# Patient Record
Sex: Male | Born: 1935 | Hispanic: No | Marital: Married
Health system: Northeastern US, Academic
[De-identification: ages and names within clinical notes are randomized; demographics above are authoritative.]

---

## 2009-09-14 IMAGING — CR RAD EXT HIP RT SINGLE VIEW
1 series · 1 of 1 positions shown · non-contrast
Comparison: none

[AP]
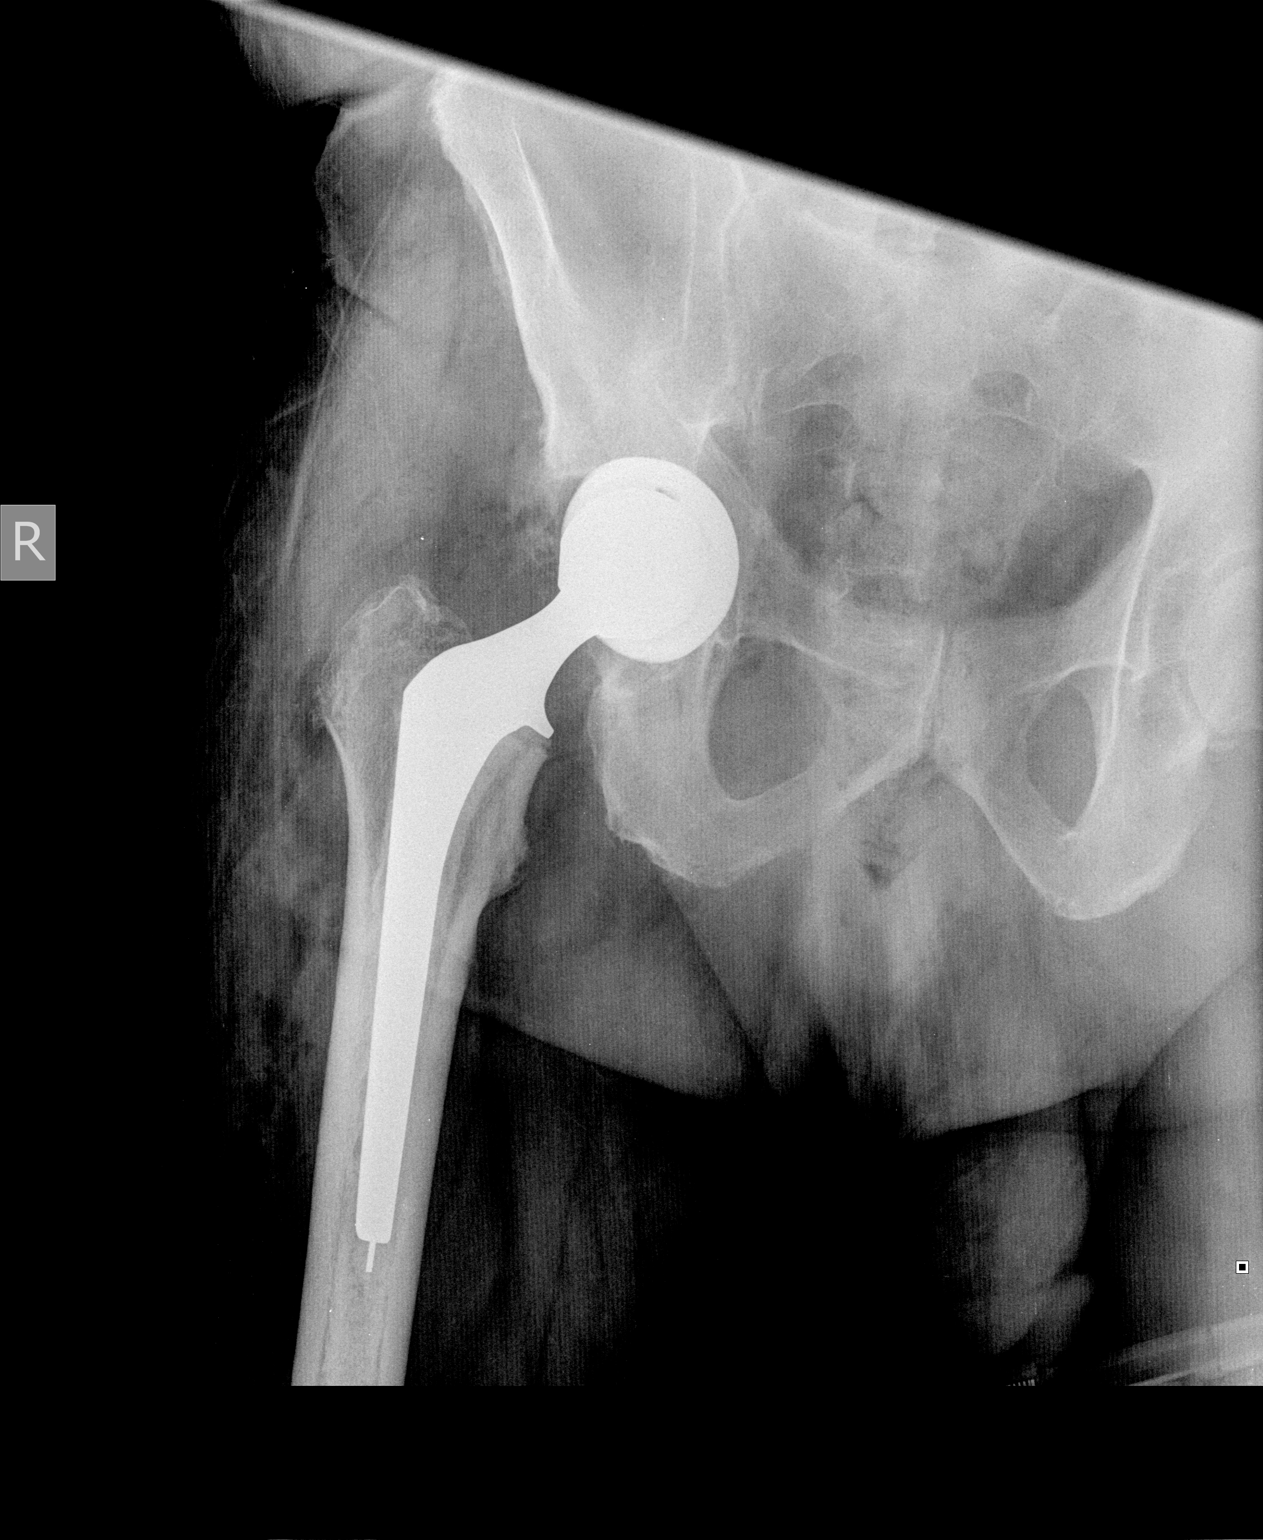

[1 of 1 positions shown; findings below may reference images not displayed]

REASON FOR EXAMINATION-  78 year old male with a hip replacement.

AP view of the right hip demonstrates that the patient has undergone

hip replacement surgery.  The acetabular and femoral components of the

prosthesis are seated in their respective bony beds.  No additional

bony abnormalities are seen.

IMPRESSION-

HIP REPLACEMENT.

## 2020-12-14 ENCOUNTER — Inpatient Hospital Stay: Admit: 2020-12-14 | Discharge: 2020-12-15 | Payer: PRIVATE HEALTH INSURANCE | Attending: Emergency Medicine

## 2020-12-15 ENCOUNTER — Emergency Department: Admit: 2020-12-15 | Payer: PRIVATE HEALTH INSURANCE | Primary: Internal Medicine

## 2020-12-15 DIAGNOSIS — R059 Cough, unspecified: Secondary | ICD-10-CM

## 2020-12-15 DIAGNOSIS — R0981 Nasal congestion: Secondary | ICD-10-CM

## 2020-12-15 DIAGNOSIS — M25562 Pain in left knee: Secondary | ICD-10-CM

## 2020-12-15 DIAGNOSIS — M25552 Pain in left hip: Secondary | ICD-10-CM

## 2020-12-15 DIAGNOSIS — M545 Low back pain, unspecified: Secondary | ICD-10-CM

## 2020-12-15 DIAGNOSIS — Z20822 Contact with and (suspected) exposure to covid-19: Secondary | ICD-10-CM

## 2020-12-15 DIAGNOSIS — J101 Influenza due to other identified influenza virus with other respiratory manifestations: Secondary | ICD-10-CM

## 2020-12-15 LAB — CBC WITH AUTO DIFFERENTIAL
BKR WAM ABSOLUTE IMMATURE GRANULOCYTES.: 0.03 x 1000/ÂµL (ref 0.00–0.30)
BKR WAM ABSOLUTE LYMPHOCYTE COUNT.: 0.48 x 1000/ÂµL — ABNORMAL LOW (ref 0.60–3.70)
BKR WAM ABSOLUTE NRBC (2 DEC): 0 x 1000/ÂµL (ref 0.00–1.00)
BKR WAM ANALYZER ANC: 4.86 x 1000/ÂµL (ref 2.00–7.60)
BKR WAM BASOPHIL ABSOLUTE COUNT.: 0.02 x 1000/ÂµL (ref 0.00–1.00)
BKR WAM BASOPHILS: 0.3 % (ref 0.0–1.4)
BKR WAM EOSINOPHIL ABSOLUTE COUNT.: 0.01 x 1000/ÂµL (ref 0.00–1.00)
BKR WAM EOSINOPHILS: 0.2 % (ref 0.0–5.0)
BKR WAM HEMATOCRIT (2 DEC): 37.1 % — ABNORMAL LOW (ref 38.50–50.00)
BKR WAM HEMOGLOBIN: 12.4 g/dL — ABNORMAL LOW (ref 13.2–17.1)
BKR WAM IMMATURE GRANULOCYTES: 0.5 % (ref 0.0–1.0)
BKR WAM LYMPHOCYTES: 7.8 % — ABNORMAL LOW (ref 17.0–50.0)
BKR WAM MCH (PG): 30 pg (ref 27.0–33.0)
BKR WAM MCHC: 33.4 g/dL (ref 31.0–36.0)
BKR WAM MCV: 89.8 fL (ref 80.0–100.0)
BKR WAM MONOCYTE ABSOLUTE COUNT.: 0.73 x 1000/ÂµL (ref 0.00–1.00)
BKR WAM MONOCYTES: 11.9 % (ref 4.0–12.0)
BKR WAM MPV: 10.5 fL (ref 8.0–12.0)
BKR WAM NEUTROPHILS: 79.3 % — ABNORMAL HIGH (ref 39.0–72.0)
BKR WAM NUCLEATED RED BLOOD CELLS: 0 % (ref 0.0–1.0)
BKR WAM PLATELETS: 152 x1000/ÂµL (ref 150–420)
BKR WAM RDW-CV: 12.5 % (ref 11.0–15.0)
BKR WAM RED BLOOD CELL COUNT.: 4.13 M/ÂµL (ref 4.00–6.00)
BKR WAM WHITE BLOOD CELL COUNT: 6.1 x1000/ÂµL (ref 4.0–11.0)

## 2020-12-15 LAB — INFLUENZA A+B/RSV BY RT-PCR
BKR INFLUENZA A: POSITIVE — AB
BKR INFLUENZA B: NEGATIVE
BKR RESPIRATORY SYNCYTIAL VIRUS: NEGATIVE

## 2020-12-15 LAB — COMPREHENSIVE METABOLIC PANEL
BKR A/G RATIO: 0.9
BKR ALANINE AMINOTRANSFERASE (ALT): 23 U/L (ref 12–78)
BKR ALBUMIN: 3.5 g/dL (ref 3.4–5.0)
BKR ALKALINE PHOSPHATASE: 556 U/L — ABNORMAL HIGH (ref 20–135)
BKR ANION GAP: 8 (ref 5–18)
BKR ASPARTATE AMINOTRANSFERASE (AST): 30 U/L (ref 5–37)
BKR AST/ALT RATIO: 1.3
BKR BILIRUBIN TOTAL: 0.3 mg/dL (ref 0.0–1.0)
BKR BLOOD UREA NITROGEN: 43 mg/dL — ABNORMAL HIGH (ref 8–25)
BKR BUN / CREAT RATIO: 10 (ref 8.0–25.0)
BKR CALCIUM: 9 mg/dL (ref 8.4–10.3)
BKR CHLORIDE: 102 mmol/L (ref 95–115)
BKR CO2: 28 mmol/L (ref 21–32)
BKR CREATININE: 4.31 mg/dL — ABNORMAL HIGH (ref 0.50–1.30)
BKR EGFR, CREATININE (CKD-EPI 2021): 13 mL/min/{1.73_m2} — ABNORMAL LOW (ref >=60)
BKR GLOBULIN: 3.8 g/dL
BKR GLUCOSE: 274 mg/dL — ABNORMAL HIGH (ref 70–100)
BKR OSMOLALITY CALCULATION: 296 mosm/kg — ABNORMAL HIGH (ref 275–295)
BKR POTASSIUM: 4.4 mmol/L (ref 3.5–5.1)
BKR PROTEIN TOTAL: 7.3 g/dL (ref 6.4–8.2)
BKR SODIUM: 138 mmol/L (ref 136–145)

## 2020-12-15 LAB — C-REACTIVE PROTEIN     (CRP): BKR C-REACTIVE PROTEIN: 6.1 mg/dL — ABNORMAL HIGH (ref 0.0–1.0)

## 2020-12-15 LAB — TROPONIN T HIGH SENSITIVITY, 1 HOUR WITH REFLEX (BH GH LMW YH)
BKR TROPONIN T HS 1 HOUR DELTA FROM 0 HOUR: -4 ng/L
BKR TROPONIN T HS 1 HOUR: 44 ng/L — ABNORMAL HIGH

## 2020-12-15 LAB — TROPONIN T HIGH SENSITIVITY, 0 HOUR BASELINE WITH REFLEX (BH GH LMW YH): BKR TROPONIN T HS 0 HOUR BASELINE: 48 ng/L — ABNORMAL HIGH

## 2020-12-15 LAB — TROPONIN T HIGH SENSITIVITY, 3 HOUR (BH GH LMW YH)
BKR TROPONIN T HS 1 HOUR DELTA FROM 0 HOUR ON 3HR: -4 ng/L
BKR TROPONIN T HS 3 HOUR DELTA FROM 0 HOUR: -5 ng/L
BKR TROPONIN T HS 3 HOUR: 43 ng/L — ABNORMAL HIGH

## 2020-12-15 LAB — PROTIME AND INR
BKR INR: 0.91 (ref 0.89–1.14)
BKR PROTHROMBIN TIME: 10.1 s (ref 9.4–12.0)

## 2020-12-15 LAB — SARS COV-2 (COVID-19) RNA: BKR SARS-COV-2 RNA (COVID-19) (YH): NEGATIVE

## 2020-12-15 MED ORDER — ACETAMINOPHEN 325 MG TABLET
325 mg | Freq: Once | ORAL | Status: CP
Start: 2020-12-15 — End: ?
  Administered 2020-12-15: 06:00:00 325 mg via ORAL

## 2020-12-15 MED ORDER — BALOXAVIR MARBOXIL 40 MG TABLET
40 mg | Freq: Once | ORAL | Status: CP
Start: 2020-12-15 — End: ?
  Administered 2020-12-15: 09:00:00 40 mg via ORAL

## 2020-12-15 MED ORDER — ALUMINUM-MAG HYDROXIDE-SIMETHICONE 200 MG-200 MG-20 MG/5 ML ORAL SUSP
200-200-20 mg/5 mL | Freq: Once | ORAL | Status: CP
Start: 2020-12-15 — End: ?
  Administered 2020-12-15: 09:00:00 200-200-20 mL via ORAL

## 2020-12-15 NOTE — ED Notes
0425: PIV removed, pt assisted onto wheelchair. Pt and daughter verbalized understanding of d/c instructions. Daughter voluntarily wheeled pt off unit safely in wheelchair

## 2020-12-15 NOTE — ED Notes
3:59 AM Pt given xofluza as per MAR. Reports an upset stomach. MD [id number]: Given maalox and graham crackers to soothe stomach

## 2020-12-15 NOTE — ED Provider Notes
? 2014 CD-Notes ?  [facility] Emergency Department	Chief Complaint:Chief Complaint Patient presents with ? Fall greater than 87 years old   BIBA s/p mechanical fall at home. Pt denies LOC, head strike. Pt taking blood thinners. Pt c/o lower back pain only.  ZOX:WRUEA Kise, 87 y.o., male, on dialysis last dialyzed today and also reported is on blood thinners, presents to the ED s/p fall. Patient reports he was sitting on edge of bed and slipped off the edge of his bed landing on the buttocks, and was unable to get up afterwards secondary to L knee / hip pain and generalized weakness.Denied sx p/t fall but states felt very tired.  Daughter reports patient felt very fatigued all day today. He was COVID-19 negative on home test earlier today.Denied head trauma or loc.  No HA, fever, chills, chest pain, shortness of breath, abdominal pain, nausea, or vomiting. Patient is s/p dialysis today without problem.  Reports cough and nasal congestion.Historian: patient, familyReview of Systems:Constitution: 	Weakness, fatigue Negative for fever, chillsENT:		Nasal congestion Negative for sore throatEYES:		Negative for vision changesCARDIO:	Negative for chest pain, palpitationsRESP:		Cough Negative shortness of breathGI:		Negative for abdominal pain, nausea, vomiting, diarrheaGU:		Negative for dysuria, hematuriaMUSC:		Hip pain, knee pain. Negative for back painSKIN:		Negative for rashNEURO:	Negative for headache, numbness, weakness.Past Medical History:No past medical history on file.No past surgical history on file.Social History: Family History:No family history on file.Medications: Medication List  You have not been prescribed any medications. Allergies as of 12/14/2020 ? (Not on File) Physical Exam:  Vital signs were reviewedBP (!) 155/71  - Pulse 76  - Temp 99.1 ?F (37.3 ?C)  - Resp 18  - Wt 72.6 kg  - SpO2 98% 	General Appear:	Well developed, well nourished, in no acute distress	Head:			normocephalic, atraumatic. Band-aid in place from lesion removal on occipital scalp. 	Ears/Nose/Throat:	Mucus membranes dry, pharynx clear	Eyes:			PERRL, EOMI	Neck:			NT c spine	Cardiovascular:	Positive S1, S2 sounds. Regular rate and rhythm.  No murmurs.	Respiratory:		Clear to auscultation without wheezes, rales or rhonchi, good and equal air entry bilaterally	Chest: 			NT chest wall	Back: 			Full range of motion, no tenderness to palp midline T/L spine or paraspinous muscles	Abdomen:		soft, nontender, nondistended; normoactive bowel sounds	Extremities:		Tenderness to the L gluteal region and L upper lateral hip. Tenderness to the LLE just above the knee. Pain with ROM L knee. No swelling. NVID	Neurologic:		Is alert and oriented x3. Motor strength normal and symmetric though pain with rom LLE. Cranial nerves 2-12 and sensation grossly intact.Medical Decision Making:Nursing notes were reviewed: YesI reviewed the following historical information: medical recordsOxygen saturation interpretation: 92% on RA -hypoxicLaboratory studies: Results for orders placed or performed during the hospital encounter of 12/14/20 SARS CoV-2 (COVID-19) RNA-[facility] ([facility])  Specimen: Nasopharynx; Viral Result Value Ref Range  SARS-CoV-2 RNA (COVID-19)  Negative Negative Influenza A+B/RSV by RT-PCR ([facility])  Specimen: Nasopharynx; Viral Result Value Ref Range  Influenza A Positive (A) Negative  Influenza B Negative Negative  Respiratory Syncytial Virus Negative Negative Protime-INR Result Value Ref Range  Prothrombin Time 10.1 9.4 - 12.0 seconds  INR 0.91 0.89 - 1.14 Troponin T High Sensitivity, Emergency; 0 hour baseline AND 1 hour with reflex (3 hour) Result Value Ref Range  High Sensitivity Troponin T 48 (H) See Comment ng/L C-reactive protein Result Value Ref Range  C-Reactive Protein 6.1 (H) 0.0 - 1.0 mg/dL CBC auto differential Result Value Ref Range  WBC 6.1 4.0 - 11.0 x1000/?L  RBC 4.13 4.00 - 6.00 M/?L  Hemoglobin 12.4 (L) 13.2 - 17.1 g/dL  Hematocrit 54.09 (L) 81.19 - 50.00 %  MCV 89.8 80.0 - 100.0 fL  MCH 30.0 27.0 - 33.0 pg  MCHC 33.4 31.0 - 36.0 g/dL  RDW-CV 16.1 09.6 - 04.5 %  Platelets 152 150 - 420 x1000/?L  MPV 10.5 8.0 - 12.0 fL  Neutrophils 79.3 (H) 39.0 - 72.0 %  Lymphocytes 7.8 (L) 17.0 - 50.0 %  Monocytes 11.9 4.0 - 12.0 %  Eosinophils 0.2 0.0 - 5.0 %  Basophil 0.3 0.0 - 1.4 %  Immature Granulocytes 0.5 0.0 - 1.0 %  nRBC 0.0 0.0 - 1.0 %  ANC(Abs Neutrophil Count) 4.86 2.00 - 7.60 x 1000/?L  Absolute Lymphocyte Count 0.48 (L) 0.60 - 3.70 x 1000/?L  Monocyte Absolute Count 0.73 0.00 - 1.00 x 1000/?L  Eosinophil Absolute Count 0.01 0.00 - 1.00 x 1000/?L  Basophil Absolute Count 0.02 0.00 - 1.00 x 1000/?L  Absolute Immature Granulocyte Count 0.03 0.00 - 0.30 x 1000/?L  Absolute nRBC 0.00 0.00 - 1.00 x 1000/?L Comprehensive metabolic panel Result Value Ref Range  Sodium 138 136 - 145 mmol/L  Potassium 4.4 3.5 - 5.1 mmol/L  Chloride 102 95 - 115 mmol/L  CO2 28 21 - 32 mmol/L  Anion Gap 8 5 - 18  Glucose 274 (H) 70 - 100 mg/dL  BUN 43 (H) 8 - 25 mg/dL  Creatinine 4.09 (H) 8.11 - 1.30 mg/dL  Calcium 9.0 8.4 - 91.4 mg/dL  BUN/Creatinine Ratio 78.2 8.0 - 25.0  Total Protein 7.3 6.4 - 8.2 g/dL  Albumin 3.5 3.4 - 5.0 g/dL  Total Bilirubin 0.3 0.0 - 1.0 mg/dL  Alkaline Phosphatase 956 (H) 20 - 135 U/L  Alanine Aminotransferase (ALT) 23 12 - 78 U/L  Aspartate Aminotransferase (AST) 30 5 - 37 U/L  Globulin 3.8 g/dL  A/G Ratio 0.9   AST/ALT Ratio 1.3 See Comment  Osmolality Calculation 296 (H) 275 - 295 mOsm/kg  eGFR (Creatinine) 13 (L) >=60 mL/min/1.24m2 Troponin T High Sensitivity, 1 Hour With Reflex ([facility]) Result Value Ref Range  High Sensitivity Troponin T 44 (H) See Comment ng/L  1 hour Delta from 0 Hour, HS-Troponin T -4 ng/L Troponin T High Sensitivity, 3 Hour ([facility]) Result Value Ref Range  High Sensitivity Troponin T 43 (H) See Comment ng/L  1 hour Delta from 0 Hour, HS-Troponin T -4 ng/L  3 hour Delta from 0 Hour, HS-Troponin T -5 ng/L EKG Result Value Ref Range  Heart Rate 89 bpm  QRS Interval 146 ms  QT Interval 398 ms  QTC Interval 484 ms  P Axis 54 deg  QRS Axis -68 deg  T Wave Axis 81 deg  P-R Interval 228 msec  SEVERITY Abnormal ECG severity Radiological Imaging Studies:Knee 2V left ED Interpretation + DJD, no acute fx or dislocation  CXR (portable) ED Interpretation neg  [place] RESULT SCAN Final Result  CARDIAC EKG RESULT SCAN Final Result  [place] Head wo IV Contrast    (Results Pending) Hip 2V Left    (Results Pending) [place] Hip Left wo IV Contrast    (Results Pending) EKG: 	Normal sinus rhythm at 89 with left bundle-branch block.  No acute ST changes or elevations.  No old available for comparisonIndependently interpreted by ED provider: Labs / EKG / RadDiscussed patient with another provider: NoSummary of ED Vital Signs:Vitals:  12/14/20 2307 12/15/20 0016 12/15/20 0330 12/15/20 0331 BP: (!) 151/69 (!) 167/72 (!) 157/63 (!) 155/71 Pulse: 76   76 Resp: 18 18 18 18  Temp: 99.1 ?F (37.3 ?C)    SpO2: 95% 95% 95% 98% Weight:  72.6 kg ED Course/Assessment/Plan:87 y.o. male presents to the ED s/p mechanical fall, with weakness all day today. SpO2 in the 91-93% range on presentation. Will need to assess possible viral etiology. Check labs. Check [place] head due to reported blood thinner use, despite patient denial of HT / LOC. Check CXR, XR Hip, and XR Knee. Reassess. 2:46 ZO:XWRUEAVW is 48 and 44.  CRP is 6.1 COVID negative.  Influenza is pending.  CBC shows no leukocytosis or significant shift.  CMP shows hyperglycemia at 274 with an elevated BUN and creatinine of 43/4.31.  Patient is on dialysis.  [place] head was negative. CXR neg.  Knee neg.  ? fx left hip.  [place] ordered.3:17 AMPatient advised of flu a positive result.  Advised to stay in hospital given pulse ox runs from 91 to 94%.  Patient states he has no shortness of breath and does not want to stay in the hospital.  He is feeling much better and states he has support at home, is almost 60 and refuses to stay in the hospital.  He states he is monitored frequently at dialysis which he has again tomorrow.  I have advised of risks and benefits of staying in the hospital.  Despite clear understanding and sats currently at 95%, patient refuses to stay.  We will give dose of xofluza now and discharged with daughter assuming left hip  scan is negative.  Patient definitely moving around his hips more and states his pain is significantly improved since Tylenol.3:20 AMCT scan of the left hip without intravenous contrast (axial sections with sagittal and coronalreformats) December 15, 2020 0225 hoursClinical History: Fracture left hipCorrelated with the prior radiographs performed earlier today.Findings:There is no evidence of fracture or dislocation. The alignment of the left hip joint is within normal limits.There are degenerative changes in the hip with acetabular osteophytes and enthesopathic changes.There are mixed lytic and sclerotic lesions in the visualized left pelvic bones and sacrum. Thevisualized soft tissues including pelvic viscera are unremarkable.Impression:No evidence of fracture or dislocation.Mixed lytic sclerotic lesions in the visualized left pelvic bones and sacrum, which may representPaget's disease. The differential consideration includes metastases. Recommend clinical correlation.Other findings as described above.Patient feels much better and does not want to stay in the hospital despite advice to do so.  He is not hypoxic.  We have been able to secure a pulse oximeter and he will go home with that with his daughter.  He is in no distress.  Patient comfortable plan of care reasons for return discussed.______________________________________________________________________Condition: 	StableDisposition: 	HomeDiagnosis: 	The primary encounter diagnosis was Influenza A. A diagnosis of Fall, initial encounter was also pertinent to this visit.By signing my name below, I, Lyanne Co, attest that this documentation has been prepared under the direction and in the presence of Brenton Grills, MD Electronically Signed: Lyanne Co, scribe. 12/14/2020. 11:47 PM.I, Brenton Grills, MD, personally performed the services described in this documentation. All medical record entries made by the scribe were at my direction and in my presence. I have reviewed the chart and discharge instructions and agree that the record reflects my personal performance and is accurate and complete. Brenton Grills, MD. 12/14/2020. 11:47 PM.?2014 CD-Notes?  [employer] All Rights Reserved; version 2.0; revised November, 2018.[email or web address] Brenton Grills, MD12/14/22 0981

## 2020-12-15 NOTE — ED Notes
Pt UTD on POC, positioned for comfort, bed in low position. All questions addressed at this time. Call bell within reach. NAD/NARD. IV access established. Pt medicated per MAR. WCTM.

## 2020-12-15 NOTE — Discharge Instructions
Flu IS  contagious.  Avoid contact with others and follow strict handwashing.As discussed with you, your oxygen saturations were low today running as low as 91% and admission was recommended.  You refused admission at this time and are being given a pulse oximeter to monitor saturations. Retutn to ED via ambulance if any shortness of breath, fever>102.7 despite meds, or with pulse ox readings less than 90%. Take Tylenol every 4 to 6 hours and/or Motrin every 6 hours with food as instructed for fever and/or body aches.Rest.Do not return to school or work for 1 week or until without fever for 3 days without the aid of any medications.Return to ED with any fevers greater than 102 despite medications, inability to tolerate p.o., syncope, shortness of breath, altered mental status, new complaint or worsening symptoms.

## 2020-12-16 NOTE — Telephone Encounter
Daughter said he is doing alright been sleeping quietly  Will be going to dialysis today and will check his  sats.thank-you for checking on him

## 2023-08-03 DEATH — deceased
